# Patient Record
Sex: Male | Born: 1965 | Race: Black or African American | Hispanic: No | Marital: Married | Smoking: Current every day smoker
Health system: Southern US, Community
[De-identification: ages and names within clinical notes are randomized; demographics above are authoritative.]

## PROBLEM LIST (undated history)

## (undated) DIAGNOSIS — I10 Essential (primary) hypertension: Secondary | ICD-10-CM

## (undated) DIAGNOSIS — E785 Hyperlipidemia, unspecified: Secondary | ICD-10-CM

## (undated) DIAGNOSIS — E119 Type 2 diabetes mellitus without complications: Secondary | ICD-10-CM

## (undated) HISTORY — DX: Hyperlipidemia, unspecified: E78.5

## (undated) HISTORY — DX: Essential (primary) hypertension: I10

## (undated) HISTORY — DX: Type 2 diabetes mellitus without complications: E11.9

---

## 2010-10-25 ENCOUNTER — Emergency Department (HOSPITAL_COMMUNITY): Payer: Medicare Other

## 2010-10-25 ENCOUNTER — Emergency Department (HOSPITAL_COMMUNITY)
Admission: EM | Admit: 2010-10-25 | Discharge: 2010-10-25 | Disposition: A | Discharge status: Home / Self Care | Payer: Medicare Other | Attending: Emergency Medicine | Admitting: Emergency Medicine

## 2010-10-25 DIAGNOSIS — R350 Frequency of micturition: Secondary | ICD-10-CM | POA: Insufficient documentation

## 2010-10-25 DIAGNOSIS — R509 Fever, unspecified: Secondary | ICD-10-CM | POA: Insufficient documentation

## 2010-10-25 DIAGNOSIS — N390 Urinary tract infection, site not specified: Secondary | ICD-10-CM | POA: Insufficient documentation

## 2010-10-25 DIAGNOSIS — M542 Cervicalgia: Secondary | ICD-10-CM | POA: Insufficient documentation

## 2010-10-25 DIAGNOSIS — R51 Headache: Secondary | ICD-10-CM | POA: Insufficient documentation

## 2010-10-25 LAB — DIFFERENTIAL
Basophils Absolute: 0 10*3/uL (ref 0.0–0.1)
Lymphs Abs: 2.2 10*3/uL (ref 0.7–4.0)
Monocytes Absolute: 2 10*3/uL — ABNORMAL HIGH (ref 0.1–1.0)
Monocytes Relative: 10 % (ref 3–12)
Neutrophils Relative %: 79 % — ABNORMAL HIGH (ref 43–77)

## 2010-10-25 LAB — CBC
Platelets: 201 10*3/uL (ref 150–400)
RDW: 14.6 % (ref 11.5–15.5)
WBC: 19.8 10*3/uL — ABNORMAL HIGH (ref 4.0–10.5)

## 2010-10-25 LAB — BASIC METABOLIC PANEL
CO2: 27 mEq/L (ref 19–32)
Calcium: 8.6 mg/dL (ref 8.4–10.5)
Creatinine, Ser: 1.17 mg/dL (ref 0.50–1.35)
Glucose, Bld: 113 mg/dL — ABNORMAL HIGH (ref 70–99)
Sodium: 137 mEq/L (ref 135–145)

## 2010-10-25 LAB — URINE MICROSCOPIC-ADD ON

## 2010-10-25 LAB — URINALYSIS, ROUTINE W REFLEX MICROSCOPIC
Nitrite: NEGATIVE
Protein, ur: NEGATIVE mg/dL
Urobilinogen, UA: 1 mg/dL (ref 0.0–1.0)

## 2010-10-28 LAB — URINE CULTURE

## 2012-11-26 IMAGING — CT CT HEAD W/O CM
1 series · 16 of 30 positions shown, 20 images · non-contrast
Comparison: None.

CLINICAL DATA: Migraine headache.  Fever and leukocytosis.

CT HEAD WITHOUT CONTRAST
TECHNIQUE: Contiguous axial images were obtained from the base of
the skull through the vertex without contrast.

[Series 2: headseq 4.8 h37s · axial · 0.50mm/px · z∈[+164,+301]mm · 16 of 30 slices shown, 20 images]
[im 2/30  brain]
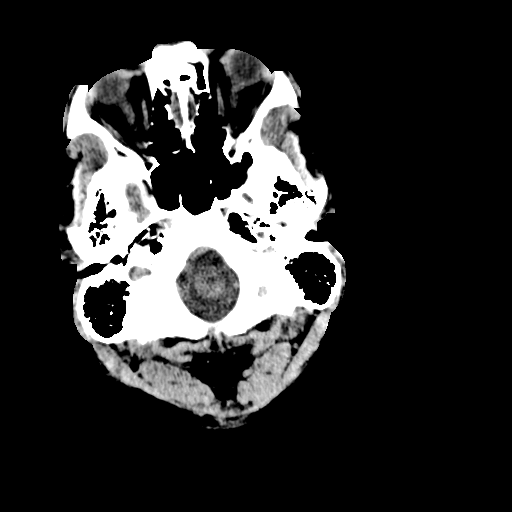
[im 2/30  bone]
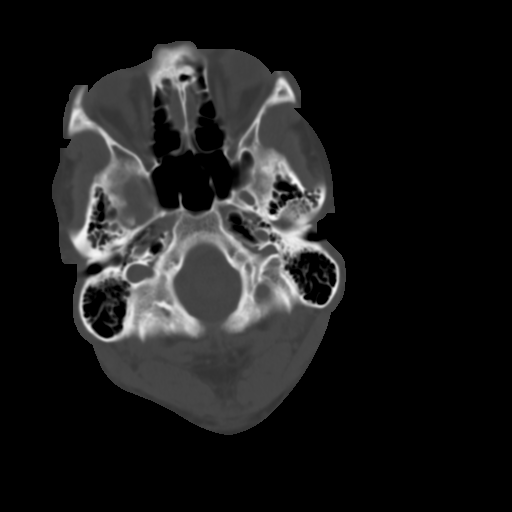
[im 4/30  brain]
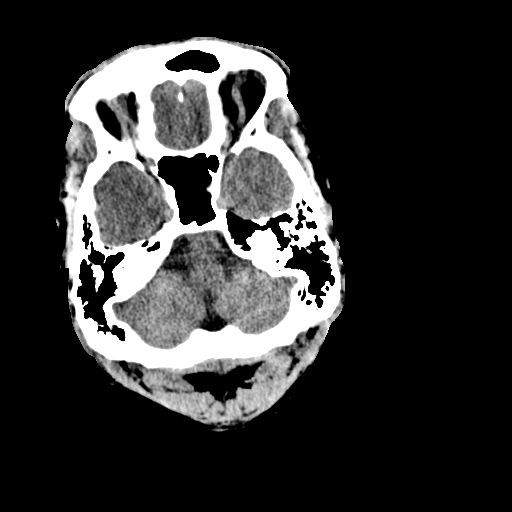
[im 6/30  brain]
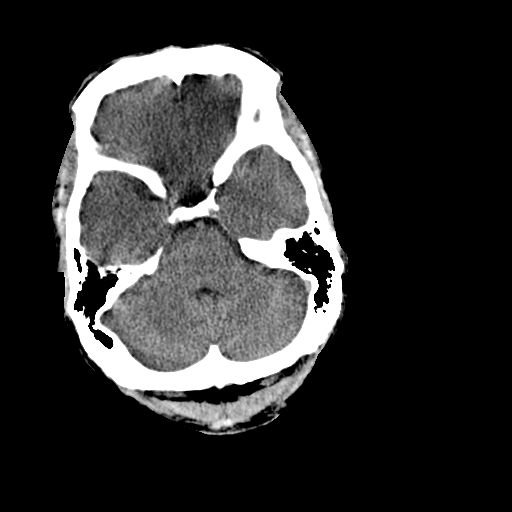
[im 8/30  brain]
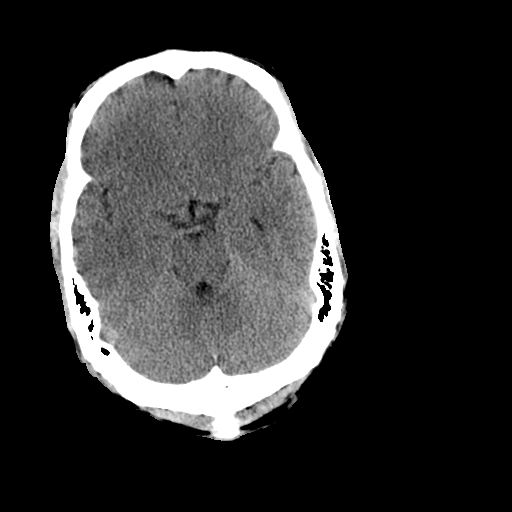
[im 9/30  brain]
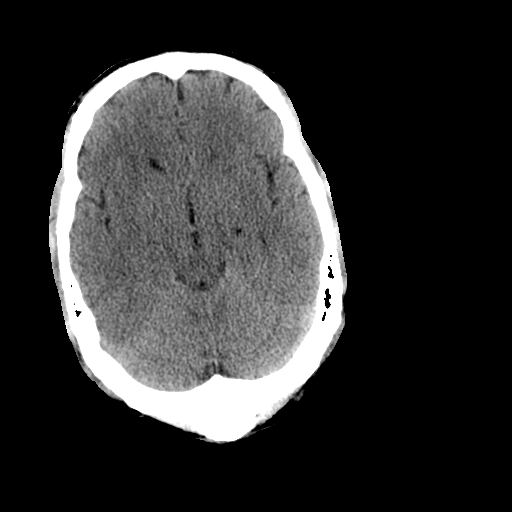
[im 9/30  bone]
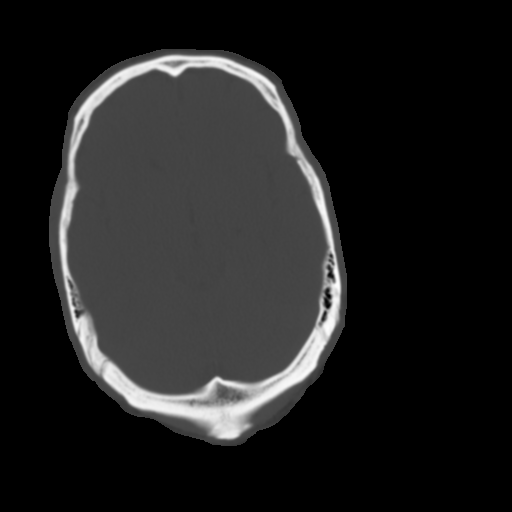
[im 11/30  brain]
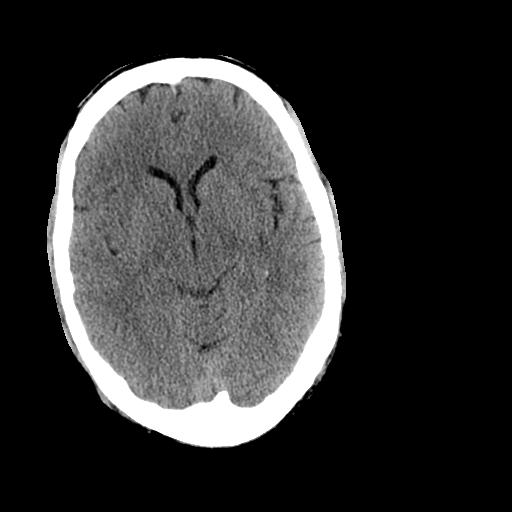
[im 13/30  brain]
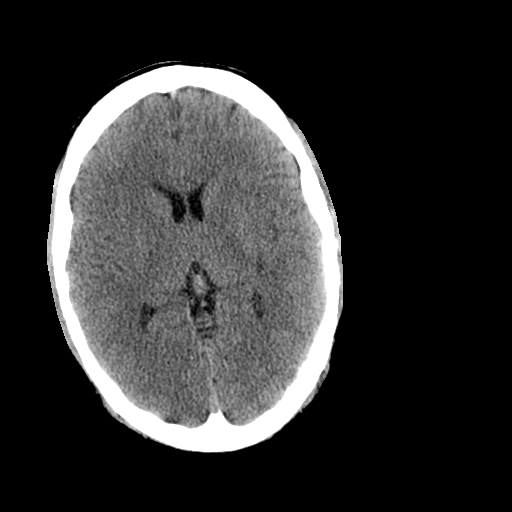
[im 15/30  brain]
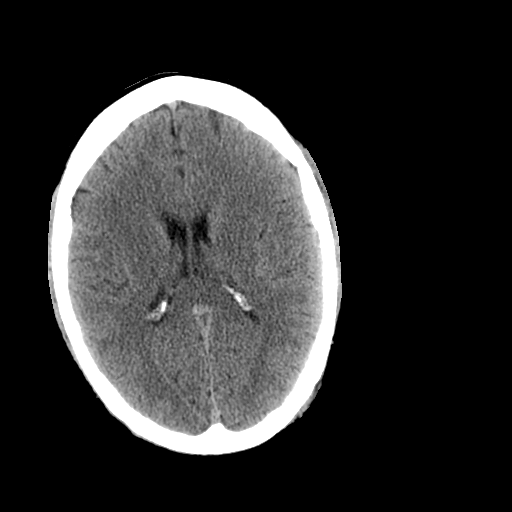
[im 16/30  brain]
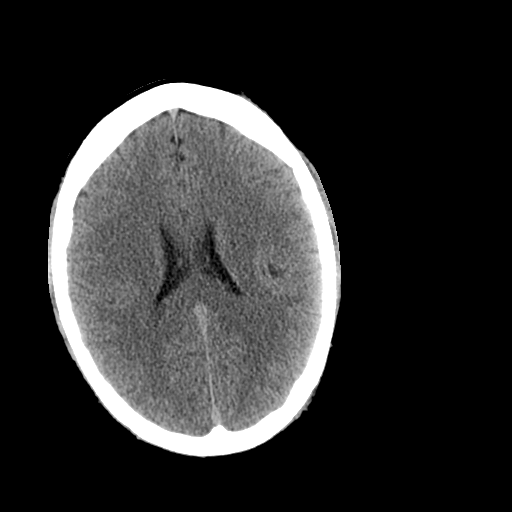
[im 16/30  bone]
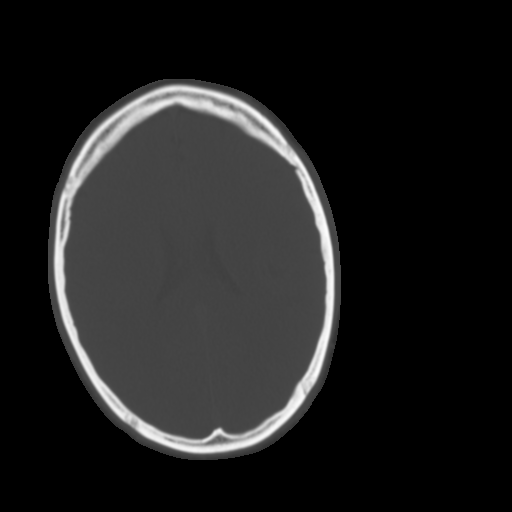
[im 18/30  brain]
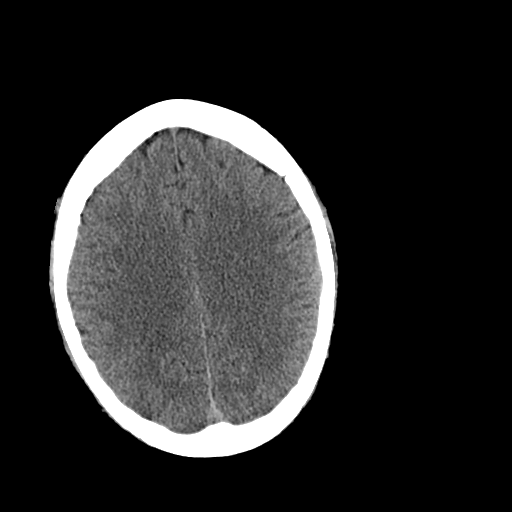
[im 20/30  brain]
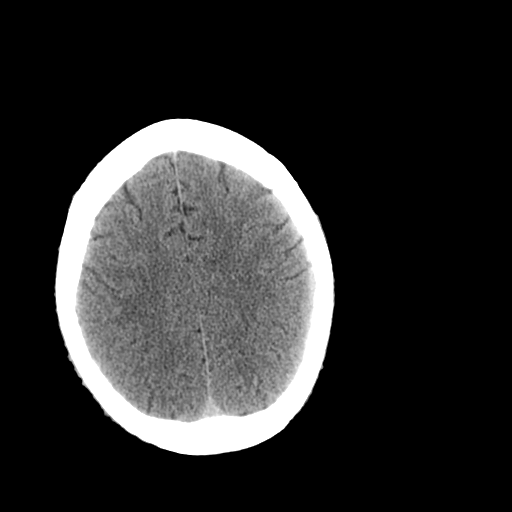
[im 22/30  brain]
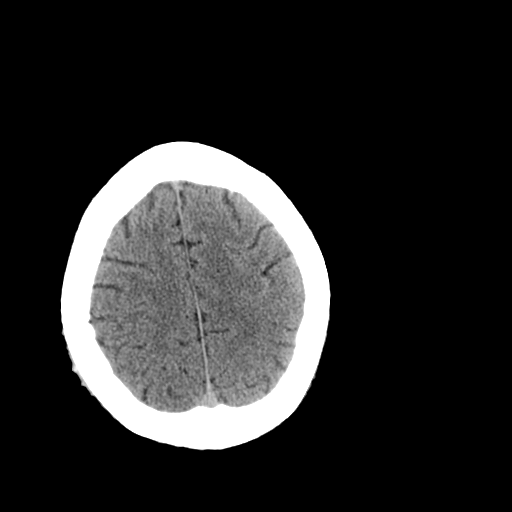
[im 23/30  brain]
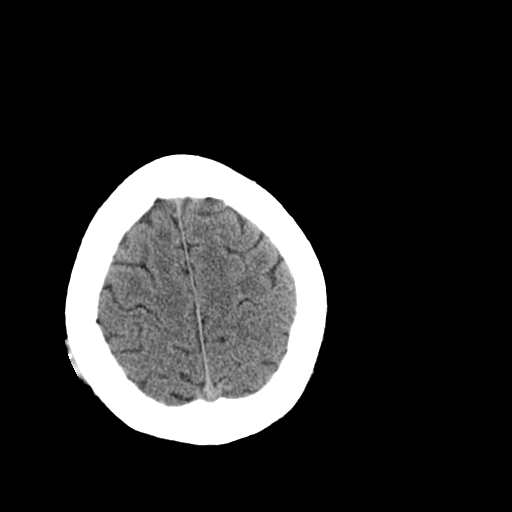
[im 23/30  bone]
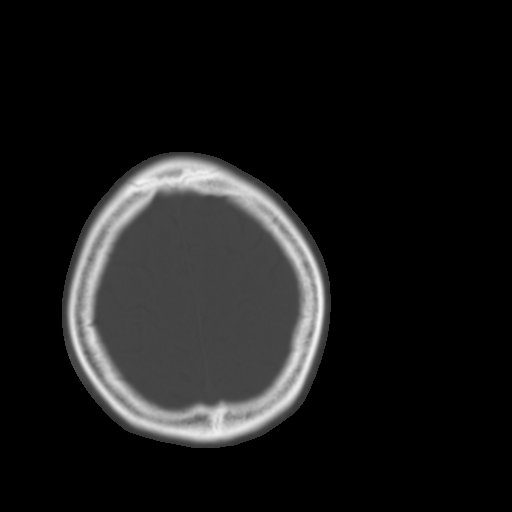
[im 25/30  brain]
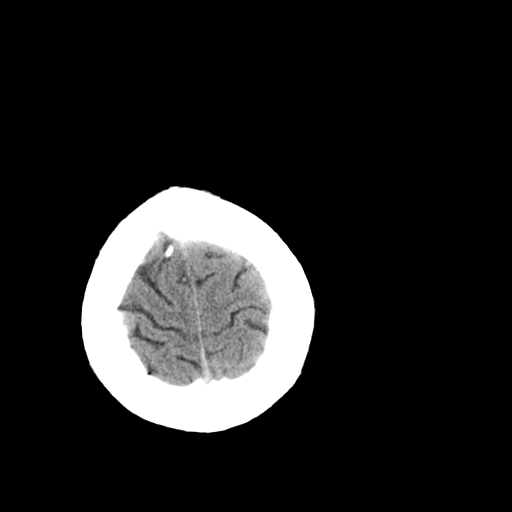
[im 27/30  brain]
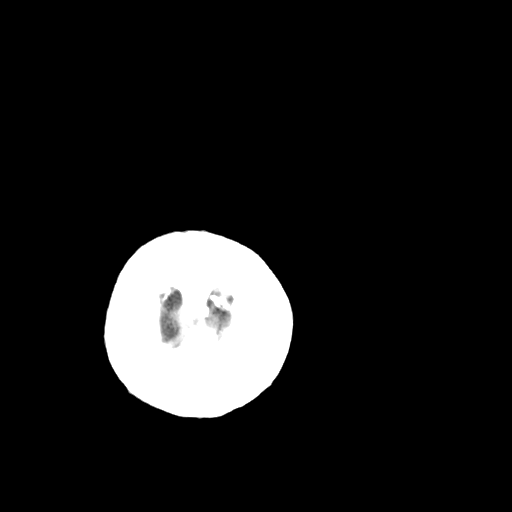
[im 29/30  brain]
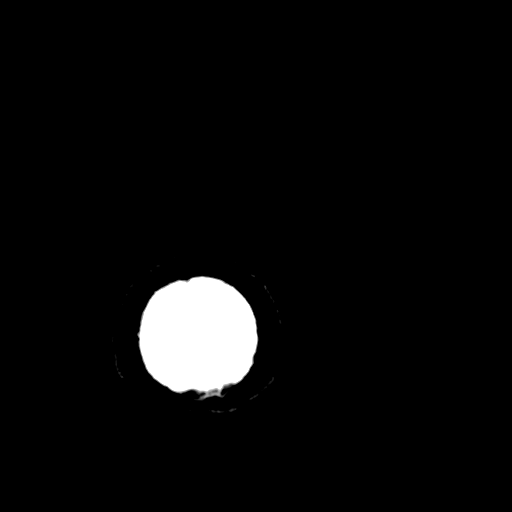

[16 of 30 positions shown; findings below may reference images not displayed]

FINDINGS: There is no evidence of acute intracranial hemorrhage,
mass lesion, brain edema or extra-axial fluid collection.  The
ventricles and subarachnoid spaces are appropriately sized for age.
There is no CT evidence of acute cortical infarction.

The visualized paranasal sinuses are clear. The calvarium is
intact.
IMPRESSION: Unremarkable noncontrast head CT.

## 2012-11-26 IMAGING — CR DG CHEST 1V PORT
1 series · 1 of 1 positions shown · non-contrast
Comparison: None.

CLINICAL DATA: Chest pain, fever

PORTABLE CHEST - 1 VIEW

[view not recorded]
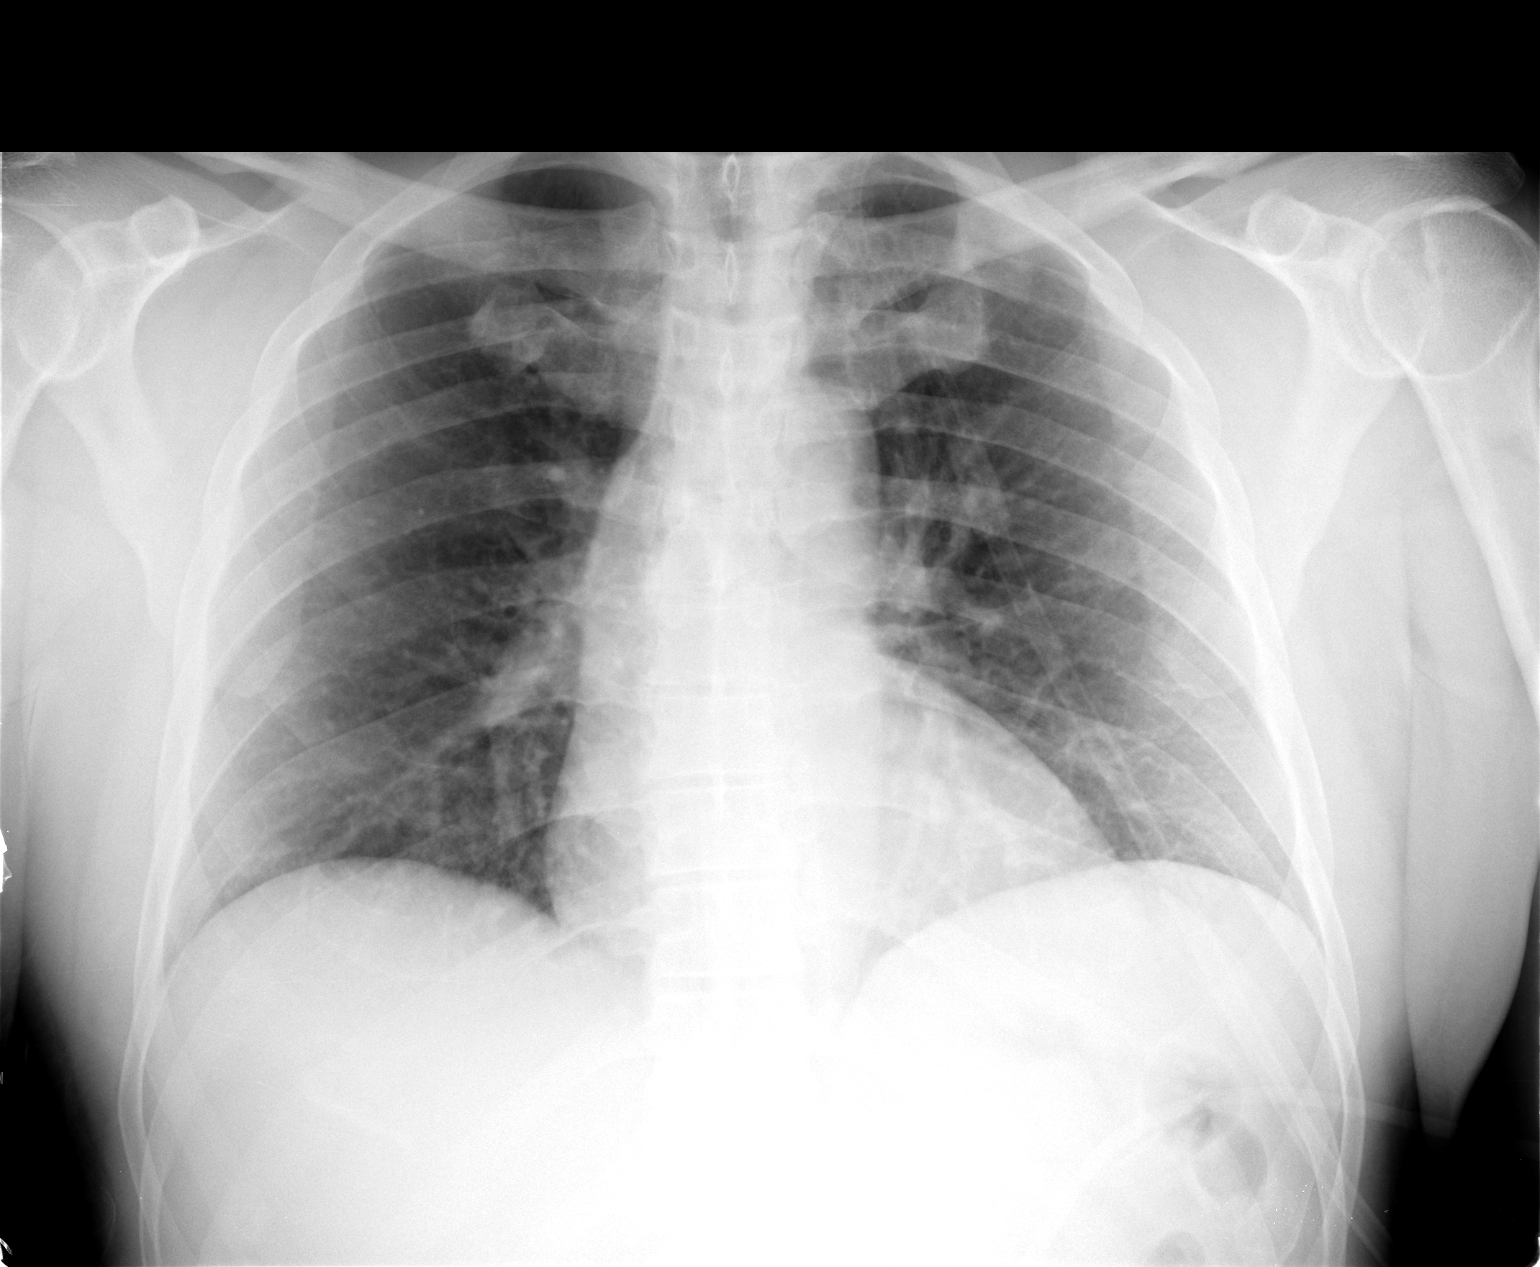

[1 of 1 positions shown; findings below may reference images not displayed]

FINDINGS: The lungs are clear.  Mediastinal contours appear normal.
The heart is within normal limits in size.  No bony abnormality is
seen.
IMPRESSION: No active lung disease.

## 2020-06-21 LAB — COMPREHENSIVE METABOLIC PANEL
Calcium: 9.1 (ref 8.7–10.7)
GFR calc Af Amer: 105

## 2020-06-21 LAB — HEMOGLOBIN A1C: Hemoglobin A1C: 6.7

## 2020-06-21 LAB — TSH: TSH: 2.22 (ref 0.41–5.90)

## 2020-06-21 LAB — BASIC METABOLIC PANEL
BUN: 8 (ref 4–21)
Creatinine: 1 (ref 0.6–1.3)
Glucose: 92

## 2020-06-21 LAB — LIPID PANEL
LDL Cholesterol: 122
Triglycerides: 122 (ref 40–160)

## 2020-08-26 ENCOUNTER — Ambulatory Visit: Payer: Medicare Other | Admitting: Nurse Practitioner

## 2020-08-26 NOTE — Patient Instructions (Incomplete)
Diabetes Mellitus and Nutrition, Adult When you have diabetes, or diabetes mellitus, it is very important to have healthy eating habits because your blood sugar (glucose) levels are greatly affected by what you eat and drink. Eating healthy foods in the right amounts, at about the same times every day, can help you:  Control your blood glucose.  Lower your risk of heart disease.  Improve your blood pressure.  Reach or maintain a healthy weight. What can affect my meal plan? Every person with diabetes is different, and each person has different needs for a meal plan. Your health care provider may recommend that you work with a dietitian to make a meal plan that is best for you. Your meal plan may vary depending on factors such as:  The calories you need.  The medicines you take.  Your weight.  Your blood glucose, blood pressure, and cholesterol levels.  Your activity level.  Other health conditions you have, such as heart or kidney disease. How do carbohydrates affect me? Carbohydrates, also called carbs, affect your blood glucose level more than any other type of food. Eating carbs naturally raises the amount of glucose in your blood. Carb counting is a method for keeping track of how many carbs you eat. Counting carbs is important to keep your blood glucose at a healthy level, especially if you use insulin or take certain oral diabetes medicines. It is important to know how many carbs you can safely have in each meal. This is different for every person. Your dietitian can help you calculate how many carbs you should have at each meal and for each snack. How does alcohol affect me? Alcohol can cause a sudden decrease in blood glucose (hypoglycemia), especially if you use insulin or take certain oral diabetes medicines. Hypoglycemia can be a life-threatening condition. Symptoms of hypoglycemia, such as sleepiness, dizziness, and confusion, are similar to symptoms of having too much  alcohol.  Do not drink alcohol if: ? Your health care provider tells you not to drink. ? You are pregnant, may be pregnant, or are planning to become pregnant.  If you drink alcohol: ? Do not drink on an empty stomach. ? Limit how much you use to:  0-1 drink a day for women.  0-2 drinks a day for men. ? Be aware of how much alcohol is in your drink. In the [place] one drink equals one 12 oz bottle of beer (355 mL), one 5 oz glass of wine (148 mL), or one 1 oz glass of hard liquor (44 mL). ? Keep yourself hydrated with water, diet soda, or unsweetened iced tea.  Keep in mind that regular soda, juice, and other mixers may contain a lot of sugar and must be counted as carbs. What are tips for following this plan? Reading food labels  Start by checking the serving size on the "Nutrition Facts" label of packaged foods and drinks. The amount of calories, carbs, fats, and other nutrients listed on the label is based on one serving of the item. Many items contain more than one serving per package.  Check the total grams (g) of carbs in one serving. You can calculate the number of servings of carbs in one serving by dividing the total carbs by 15. For example, if a food has 30 g of total carbs per serving, it would be equal to 2 servings of carbs.  Check the number of grams (g) of saturated fats and trans fats in one serving. Choose foods that have   a low amount or none of these fats.  Check the number of milligrams (mg) of salt (sodium) in one serving. Most people should limit total sodium intake to less than 2,300 mg per day.  Always check the nutrition information of foods labeled as "low-fat" or "nonfat." These foods may be higher in added sugar or refined carbs and should be avoided.  Talk to your dietitian to identify your daily goals for nutrients listed on the label. Shopping  Avoid buying canned, pre-made, or processed foods. These foods tend to be high in fat, sodium, and added  sugar.  Shop around the outside edge of the grocery store. This is where you will most often find fresh fruits and vegetables, bulk grains, fresh meats, and fresh dairy. Cooking  Use low-heat cooking methods, such as baking, instead of high-heat cooking methods like deep frying.  Cook using healthy oils, such as olive, canola, or sunflower oil.  Avoid cooking with butter, cream, or high-fat meats. Meal planning  Eat meals and snacks regularly, preferably at the same times every day. Avoid going long periods of time without eating.  Eat foods that are high in fiber, such as fresh fruits, vegetables, beans, and whole grains. Talk with your dietitian about how many servings of carbs you can eat at each meal.  Eat 4-6 oz (112-168 g) of lean protein each day, such as lean meat, chicken, fish, eggs, or tofu. One ounce (oz) of lean protein is equal to: ? 1 oz (28 g) of meat, chicken, or fish. ? 1 egg. ?  cup (62 g) of tofu.  Eat some foods each day that contain healthy fats, such as avocado, nuts, seeds, and fish.   What foods should I eat? Fruits Berries. Apples. Oranges. Peaches. Apricots. Plums. Grapes. Mango. Papaya. Pomegranate. Kiwi. Cherries. Vegetables Lettuce. Spinach. Leafy greens, including kale, chard, collard greens, and mustard greens. Beets. Cauliflower. Cabbage. Broccoli. Carrots. Green beans. Tomatoes. Peppers. Onions. Cucumbers. Brussels sprouts. Grains Whole grains, such as whole-wheat or whole-grain bread, crackers, tortillas, cereal, and pasta. Unsweetened oatmeal. Quinoa. Brown or wild rice. Meats and other proteins Seafood. Poultry without skin. Lean cuts of poultry and beef. Tofu. Nuts. Seeds. Dairy Low-fat or fat-free dairy products such as milk, yogurt, and cheese. The items listed above may not be a complete list of foods and beverages you can eat. Contact a dietitian for more information. What foods should I avoid? Fruits Fruits canned with  syrup. Vegetables Canned vegetables. Frozen vegetables with butter or cream sauce. Grains Refined white flour and flour products such as bread, pasta, snack foods, and cereals. Avoid all processed foods. Meats and other proteins Fatty cuts of meat. Poultry with skin. Breaded or fried meats. Processed meat. Avoid saturated fats. Dairy Full-fat yogurt, cheese, or milk. Beverages Sweetened drinks, such as soda or iced tea. The items listed above may not be a complete list of foods and beverages you should avoid. Contact a dietitian for more information. Questions to ask a health care provider  Do I need to meet with a diabetes educator?  Do I need to meet with a dietitian?  What number can I call if I have questions?  When are the best times to check my blood glucose? Where to find more information:  American Diabetes Association: diabetes.org  Academy of Nutrition and Dietetics: www.eatright.org  National Institute of Diabetes and Digestive and Kidney Diseases: www.niddk.nih.gov  Association of Diabetes Care and Education Specialists: www.diabeteseducator.org Summary  It is important to have healthy eating   habits because your blood sugar (glucose) levels are greatly affected by what you eat and drink.  A healthy meal plan will help you control your blood glucose and maintain a healthy lifestyle.  Your health care provider may recommend that you work with a dietitian to make a meal plan that is best for you.  Keep in mind that carbohydrates (carbs) and alcohol have immediate effects on your blood glucose levels. It is important to count carbs and to use alcohol carefully. This information is not intended to replace advice given to you by your health care provider. Make sure you discuss any questions you have with your health care provider. Document Revised: 03/18/2019 Document Reviewed: 03/18/2019 Elsevier Patient Education  2021 [employer]  

## 2020-09-14 ENCOUNTER — Ambulatory Visit: Payer: Medicare Other | Admitting: Nurse Practitioner

## 2020-09-29 ENCOUNTER — Ambulatory Visit (INDEPENDENT_AMBULATORY_CARE_PROVIDER_SITE_OTHER): Payer: Medicare Other | Admitting: Nurse Practitioner

## 2020-09-29 ENCOUNTER — Encounter: Payer: Self-pay | Admitting: Nurse Practitioner

## 2020-09-29 VITALS — BP 145/97 | HR 65 | Ht 72.0 in | Wt 258.0 lb

## 2020-09-29 DIAGNOSIS — E782 Mixed hyperlipidemia: Secondary | ICD-10-CM | POA: Diagnosis not present

## 2020-09-29 DIAGNOSIS — I10 Essential (primary) hypertension: Secondary | ICD-10-CM | POA: Diagnosis not present

## 2020-09-29 DIAGNOSIS — E119 Type 2 diabetes mellitus without complications: Secondary | ICD-10-CM

## 2020-09-29 LAB — POCT GLYCOSYLATED HEMOGLOBIN (HGB A1C): Hemoglobin A1C: 6.5 % — AB (ref 4.0–5.6)

## 2020-09-29 MED ORDER — METFORMIN HCL ER 500 MG PO TB24: 500.0000 mg | ORAL_TABLET | Freq: Every day | ORAL | 3 refills | Status: AC

## 2020-09-29 NOTE — Patient Instructions (Signed)
Diabetes Mellitus and Nutrition, Adult When you have diabetes, or diabetes mellitus, it is very important to have healthy eating habits because your blood sugar (glucose) levels are greatly affected by what you eat and drink. Eating healthy foods in the right amounts, at about the same times every day, can help you:  Control your blood glucose.  Lower your risk of heart disease.  Improve your blood pressure.  Reach or maintain a healthy weight. What can affect my meal plan? Every person with diabetes is different, and each person has different needs for a meal plan. Your health care provider may recommend that you work with a dietitian to make a meal plan that is best for you. Your meal plan may vary depending on factors such as:  The calories you need.  The medicines you take.  Your weight.  Your blood glucose, blood pressure, and cholesterol levels.  Your activity level.  Other health conditions you have, such as heart or kidney disease. How do carbohydrates affect me? Carbohydrates, also called carbs, affect your blood glucose level more than any other type of food. Eating carbs naturally raises the amount of glucose in your blood. Carb counting is a method for keeping track of how many carbs you eat. Counting carbs is important to keep your blood glucose at a healthy level, especially if you use insulin or take certain oral diabetes medicines. It is important to know how many carbs you can safely have in each meal. This is different for every person. Your dietitian can help you calculate how many carbs you should have at each meal and for each snack. How does alcohol affect me? Alcohol can cause a sudden decrease in blood glucose (hypoglycemia), especially if you use insulin or take certain oral diabetes medicines. Hypoglycemia can be a life-threatening condition. Symptoms of hypoglycemia, such as sleepiness, dizziness, and confusion, are similar to symptoms of having too much  alcohol.  Do not drink alcohol if: ? Your health care provider tells you not to drink. ? You are pregnant, may be pregnant, or are planning to become pregnant.  If you drink alcohol: ? Do not drink on an empty stomach. ? Limit how much you use to:  0-1 drink a day for women.  0-2 drinks a day for men. ? Be aware of how much alcohol is in your drink. In the [place] one drink equals one 12 oz bottle of beer (355 mL), one 5 oz glass of wine (148 mL), or one 1 oz glass of hard liquor (44 mL). ? Keep yourself hydrated with water, diet soda, or unsweetened iced tea.  Keep in mind that regular soda, juice, and other mixers may contain a lot of sugar and must be counted as carbs. What are tips for following this plan? Reading food labels  Start by checking the serving size on the "Nutrition Facts" label of packaged foods and drinks. The amount of calories, carbs, fats, and other nutrients listed on the label is based on one serving of the item. Many items contain more than one serving per package.  Check the total grams (g) of carbs in one serving. You can calculate the number of servings of carbs in one serving by dividing the total carbs by 15. For example, if a food has 30 g of total carbs per serving, it would be equal to 2 servings of carbs.  Check the number of grams (g) of saturated fats and trans fats in one serving. Choose foods that have   a low amount or none of these fats.  Check the number of milligrams (mg) of salt (sodium) in one serving. Most people should limit total sodium intake to less than 2,300 mg per day.  Always check the nutrition information of foods labeled as "low-fat" or "nonfat." These foods may be higher in added sugar or refined carbs and should be avoided.  Talk to your dietitian to identify your daily goals for nutrients listed on the label. Shopping  Avoid buying canned, pre-made, or processed foods. These foods tend to be high in fat, sodium, and added  sugar.  Shop around the outside edge of the grocery store. This is where you will most often find fresh fruits and vegetables, bulk grains, fresh meats, and fresh dairy. Cooking  Use low-heat cooking methods, such as baking, instead of high-heat cooking methods like deep frying.  Cook using healthy oils, such as olive, canola, or sunflower oil.  Avoid cooking with butter, cream, or high-fat meats. Meal planning  Eat meals and snacks regularly, preferably at the same times every day. Avoid going long periods of time without eating.  Eat foods that are high in fiber, such as fresh fruits, vegetables, beans, and whole grains. Talk with your dietitian about how many servings of carbs you can eat at each meal.  Eat 4-6 oz (112-168 g) of lean protein each day, such as lean meat, chicken, fish, eggs, or tofu. One ounce (oz) of lean protein is equal to: ? 1 oz (28 g) of meat, chicken, or fish. ? 1 egg. ?  cup (62 g) of tofu.  Eat some foods each day that contain healthy fats, such as avocado, nuts, seeds, and fish.   What foods should I eat? Fruits Berries. Apples. Oranges. Peaches. Apricots. Plums. Grapes. Mango. Papaya. Pomegranate. Kiwi. Cherries. Vegetables Lettuce. Spinach. Leafy greens, including kale, chard, collard greens, and mustard greens. Beets. Cauliflower. Cabbage. Broccoli. Carrots. Green beans. Tomatoes. Peppers. Onions. Cucumbers. Brussels sprouts. Grains Whole grains, such as whole-wheat or whole-grain bread, crackers, tortillas, cereal, and pasta. Unsweetened oatmeal. Quinoa. Brown or wild rice. Meats and other proteins Seafood. Poultry without skin. Lean cuts of poultry and beef. Tofu. Nuts. Seeds. Dairy Low-fat or fat-free dairy products such as milk, yogurt, and cheese. The items listed above may not be a complete list of foods and beverages you can eat. Contact a dietitian for more information. What foods should I avoid? Fruits Fruits canned with  syrup. Vegetables Canned vegetables. Frozen vegetables with butter or cream sauce. Grains Refined white flour and flour products such as bread, pasta, snack foods, and cereals. Avoid all processed foods. Meats and other proteins Fatty cuts of meat. Poultry with skin. Breaded or fried meats. Processed meat. Avoid saturated fats. Dairy Full-fat yogurt, cheese, or milk. Beverages Sweetened drinks, such as soda or iced tea. The items listed above may not be a complete list of foods and beverages you should avoid. Contact a dietitian for more information. Questions to ask a health care provider  Do I need to meet with a diabetes educator?  Do I need to meet with a dietitian?  What number can I call if I have questions?  When are the best times to check my blood glucose? Where to find more information:  American Diabetes Association: diabetes.org  Academy of Nutrition and Dietetics: www.eatright.org  National Institute of Diabetes and Digestive and Kidney Diseases: www.niddk.nih.gov  Association of Diabetes Care and Education Specialists: www.diabeteseducator.org Summary  It is important to have healthy eating   habits because your blood sugar (glucose) levels are greatly affected by what you eat and drink.  A healthy meal plan will help you control your blood glucose and maintain a healthy lifestyle.  Your health care provider may recommend that you work with a dietitian to make a meal plan that is best for you.  Keep in mind that carbohydrates (carbs) and alcohol have immediate effects on your blood glucose levels. It is important to count carbs and to use alcohol carefully. This information is not intended to replace advice given to you by your health care provider. Make sure you discuss any questions you have with your health care provider. Document Revised: 03/18/2019 Document Reviewed: 03/18/2019 Elsevier Patient Education  2021 [employer].  

## 2020-09-29 NOTE — Progress Notes (Signed)
Endocrinology Consult Note       09/29/2020, 3:07 PM   Subjective:    Patient ID: Alejandro Horton, male    DOB: [birth year].  Alejandro Horton is being seen in consultation for management of currently uncontrolled symptomatic diabetes requested by  Smith Robert, MD.   Past Medical History:  Diagnosis Date  . Diabetes mellitus, type II (HCC)   . Hyperlipidemia   . Hypertension     History reviewed. No pertinent surgical history.  Social History   Socioeconomic History  . Marital status: Married    Spouse name: Not on file  . Number of children: Not on file  . Years of education: Not on file  . Highest education level: Not on file  Occupational History  . Not on file  Tobacco Use  . Smoking status: Not on file  . Smokeless tobacco: Not on file  Substance and Sexual Activity  . Alcohol use: Not on file  . Drug use: Not on file  . Sexual activity: Not on file  Other Topics Concern  . Not on file  Social History Narrative  . Not on file   Social Determinants of Health   Financial Resource Strain: Not on file  Food Insecurity: Not on file  Transportation Needs: Not on file  Physical Activity: Not on file  Stress: Not on file  Social Connections: Not on file    History reviewed. No pertinent family history.  Outpatient Encounter Medications as of 09/29/2020  Medication Sig  . amLODipine (NORVASC) 2.5 MG tablet Take by mouth daily.  . fluconazole (DIFLUCAN) 200 MG tablet Take 200 mg by mouth daily.  . hydrochlorothiazide (HYDRODIURIL) 25 MG tablet Take 25 mg by mouth daily.  . metFORMIN (GLUCOPHAGE-XR) 500 MG 24 hr tablet Take 1 tablet (500 mg total) by mouth daily with breakfast.  . montelukast (SINGULAIR) 10 MG tablet daily.  . traZODone (DESYREL) 100 MG tablet at bedtime.  Marland Kitchen atorvastatin (LIPITOR) 20 MG tablet daily.  . clotrimazole (MYCELEX) 10 MG troche Take 10 mg by mouth 5 (five) times daily.  Marland Kitchen  losartan (COZAAR) 50 MG tablet Take 1 tablet by mouth daily.  Marland Kitchen omeprazole (PRILOSEC) 20 MG capsule daily.  . Oxycodone HCl 10 MG TABS in the morning, at noon, in the evening, and at bedtime.  . [DISCONTINUED] metFORMIN (GLUCOPHAGE) 500 MG tablet Take 1 tablet by mouth daily.   No facility-administered encounter medications on file as of 09/29/2020.    ALLERGIES: No Known Allergies  VACCINATION STATUS:  There is no immunization history on file for this patient.  Diabetes He presents for his initial diabetic visit. He has type 2 diabetes mellitus. Onset time: Recently diagnosed at age of 50. His disease course has been improving. There are no hypoglycemic associated symptoms. Associated symptoms include weight loss. (Persistent thrush) There are no hypoglycemic complications. Symptoms are stable. There are no diabetic complications. Risk factors for coronary artery disease include diabetes mellitus, dyslipidemia, hypertension, male sex, obesity, sedentary lifestyle and tobacco exposure. Current diabetic treatment includes oral agent (monotherapy). He is compliant with treatment most of the time. His weight is decreasing steadily. He is following a generally unhealthy diet. When asked about meal  planning, he reported none. He has not had a previous visit with a dietitian. He rarely participates in exercise. His home blood glucose trend is fluctuating minimally. His overall blood glucose range is 180-200 mg/dl. (He presents today for his consultation, accompanied by his wife, with no meter or logs to review.  They have been checking glucose every other day with readings between 180-220.  His POCT A1c today is 6.5%, improving from last reading of 6.7% on 2/28.  He was recently diagnosed with diabetes in February of this year in which he was started on Metformin 500 mg po daily.  He admits to drinking water, coffee (with cream and sugar), and hot chocolate.  He typically skips lunch on most days and snacks  occasionally.  He does not engage in routine physical activity due to recent back pain.  He has not had a recent eye exam.  He denies any s/s of hypoglycemia.  His main concern is his thrush that he has been suffering from for over 9 months now despite his PCPs best attempts to treat it.) An ACE inhibitor/angiotensin II receptor blocker is being taken. He does not see a podiatrist.Eye exam is not current.  Hypertension This is a chronic problem. The current episode started more than 1 year ago. The problem has been gradually improving since onset. The problem is uncontrolled. There are no associated agents to hypertension. Risk factors for coronary artery disease include diabetes mellitus, dyslipidemia, family history, obesity, male gender, sedentary lifestyle and smoking/tobacco exposure. Past treatments include calcium channel blockers, diuretics and angiotensin blockers. The current treatment provides mild improvement. Compliance problems include diet and exercise.   Hyperlipidemia This is a chronic problem. The current episode started more than 1 year ago. The problem is uncontrolled. Recent lipid tests were reviewed and are high. Exacerbating diseases include diabetes and obesity. Factors aggravating his hyperlipidemia include fatty foods, thiazides and smoking. Current antihyperlipidemic treatment includes statins. The current treatment provides mild improvement of lipids. Compliance problems include adherence to diet and adherence to exercise.  Risk factors for coronary artery disease include diabetes mellitus, dyslipidemia, family history, hypertension, male sex, obesity and a sedentary lifestyle.     Review of systems  Constitutional: + steadily decreasing body weight (per patient), current Body mass index is 34.99 kg/m., no fatigue, no subjective hyperthermia, no subjective hypothermia Eyes: no blurry vision, no xerophthalmia ENT: no sore throat, no nodules palpated in throat, no  dysphagia/odynophagia, no hoarseness, + thrush infection in mouth for over 9 months Cardiovascular: no chest pain, no shortness of breath, no palpitations, no leg swelling Respiratory: + productive cough with brownish black sputum, no shortness of breath Gastrointestinal: no nausea/vomiting/diarrhea Musculoskeletal: c/o back pain Skin: no rashes, no hyperemia Neurological: no tremors, no numbness, no tingling, no dizziness Psychiatric: no depression, no anxiety  Objective:     BP (!) 145/97   Pulse 65   Ht 6' (1.829 m)   Wt 258 lb (117 kg)   BMI 34.99 kg/m   Wt Readings from Last 3 Encounters:  09/29/20 258 lb (117 kg)     BP Readings from Last 3 Encounters:  09/29/20 (!) 145/97     Physical Exam- Limited  Constitutional:  Body mass index is 34.99 kg/m. , not in acute distress, normal state of mind Eyes:  EOMI, no exophthalmos Neck: Supple Cardiovascular: RRR, no murmurs, rubs, or gallops, no edema Respiratory: Adequate breathing efforts, no crackles, rales, rhonchi, or wheezing Musculoskeletal: no gross deformities, strength intact in all four  extremities, no gross restriction of joint movements Skin:  no rashes, no hyperemia Neurological: no tremor with outstretched hands    CMP ( most recent) CMP     Component Value Date/Time   NA 137 10/25/2010 1709   K 3.5 10/25/2010 1709   CL 104 10/25/2010 1709   CO2 27 10/25/2010 1709   GLUCOSE 113 (H) 10/25/2010 1709   BUN 8 06/21/2020 0000   CREATININE 1.0 06/21/2020 0000   CREATININE 1.17 10/25/2010 1709   CALCIUM 9.1 06/21/2020 0000   GFRNONAA >60 10/25/2010 1709   GFRAA 105 06/21/2020 0000     Diabetic Labs (most recent): Lab Results  Component Value Date   HGBA1C 6.5 (A) 09/29/2020   HGBA1C 6.7 06/21/2020     Lipid Panel ( most recent) Lipid Panel     Component Value Date/Time   TRIG 122 06/21/2020 0000   LDLCALC 122 06/21/2020 0000      Lab Results  Component Value Date   TSH 2.22 06/21/2020            Assessment & Plan:   1) Type 2 diabetes mellitus without complication, without long-term current use of insulin (HCC)  He presents today for his consultation, accompanied by his wife, with no meter or logs to review.  They have been checking glucose every other day with readings between 180-220.  His POCT A1c today is 6.5%, improving from last reading of 6.7% on 2/28.  He was recently diagnosed with diabetes in February of this year in which he was started on Metformin 500 mg po daily.  He admits to drinking water, coffee (with cream and sugar), and hot chocolate.  He typically skips lunch on most days and snacks occasionally.  He does not engage in routine physical activity due to recent back pain.  He has not had a recent eye exam.  He denies any s/s of hypoglycemia.  His main concern is his thrush that he has been suffering from for over 9 months now despite his PCPs best attempts to treat it.  - Alejandro Horton has currently uncontrolled symptomatic type 2 DM since 55 years of age (newly diagnosed), with most recent A1c of 6.5 %.   -Recent labs reviewed.  - I had a long discussion with him about the progressive nature of diabetes and the pathology behind its complications. -his diabetes is complicated by tobacco use and persistent thrush infection and he remains at a high risk for more acute and chronic complications which include CAD, CVA, CKD, retinopathy, and neuropathy. These are all discussed in detail with him.  - I have counseled him on diet and weight management by adopting a carbohydrate restricted/protein rich diet. Patient is encouraged to switch to unprocessed or minimally processed complex starch and increased protein intake (animal or plant source), fruits, and vegetables. -  he is advised to stick to a routine mealtimes to eat 3 meals a day and avoid unnecessary snacks (to snack only to correct hypoglycemia).   - he acknowledges that there is a room for improvement in  his food and drink choices. - Suggestion is made for him to avoid simple carbohydrates from his diet including Cakes, Sweet Desserts, Ice Cream, Soda (diet and regular), Sweet Tea, Candies, Chips, Cookies, Store Bought Juices, Alcohol in Excess of 1-2 drinks a day, Artificial Sweeteners, Coffee Creamer, and "Sugar-free" Products. This will help patient to have more stable blood glucose profile and potentially avoid unintended weight gain.  - I have approached him with  the following individualized plan to manage his diabetes and patient agrees:   -he is encouraged to start monitoring glucose 4 times daily, before meals and before bed, to log their readings on the clinic sheets provided, and bring them to review at follow up appointment in 2 weeks.  - Adjustment parameters are given to him for hypo and hyperglycemia in writing. - he is encouraged to call clinic for blood glucose levels less than 70 or above 300 mg /dl. - he is advised to continue Metformin 500 mg ER once daily with breakfast (changed to ER form to avoid GI side effects), therapeutically suitable for patient .  - he will be considered for incretin therapy as appropriate next visit.  - Specific targets for  A1c; LDL, HDL, and Triglycerides were discussed with the patient.  2) Blood Pressure /Hypertension:  his blood pressure is controlled to target.   he is advised to continue his current medications including Norvasc 2.5 mg p.o. daily with breakfast, HCTZ 25 mg po daily, and Losartan 50 mg po daily.  3) Lipids/Hyperlipidemia:    Review of his recent lipid panel from 06/21/20 showed uncontrolled LDL at 122.  he is advised to continue Lipitor 20 mg daily at bedtime.  Side effects and precautions discussed with him.  4)  Weight/Diet:  his Body mass index is 34.99 kg/m.  -  clearly complicating his diabetes care.   he is a candidate for weight loss. I discussed with him the fact that loss of 5 - 10% of his  current body weight will  have the most impact on his diabetes management.  Exercise, and detailed carbohydrates information provided  -  detailed on discharge instructions.  5) Chronic Care/Health Maintenance: -he is on ACEI/ARB and Statin medications and is encouraged to initiate and continue to follow up with Ophthalmology, Dentist, Podiatrist at least yearly or according to recommendations, and advised to QUIT SMOKING. I have recommended yearly flu vaccine and pneumonia vaccine at least every 5 years; moderate intensity exercise for up to 150 minutes weekly; and sleep for at least 7 hours a day.  - he is advised to maintain close follow up with Smith RobertKikel, Stephen, MD for primary care needs, as well as his other providers for optimal and coordinated care.  I advised him to reach back out to PCP regarding his thrush who I believe has also referred him to GI specialist for further evaluation.   - Time spent in this patient care: 60 min, of which > 50% was spent in counseling him about his diabetes and the rest reviewing his blood glucose logs, discussing his hypoglycemia and hyperglycemia episodes, reviewing his current and previous labs/studies (including abstraction from other facilities) and medications doses and developing a long term treatment plan based on the latest standards of care/guidelines; and documenting his care.    Please refer to Patient Instructions for Blood Glucose Monitoring and Insulin/Medications Dosing Guide" in media tab for additional information. Please also refer to "Patient Self Inventory" in the Media tab for reviewed elements of pertinent patient history.  Alejandro Horton participated in the discussions, expressed understanding, and voiced agreement with the above plans.  All questions were answered to his satisfaction. he is encouraged to contact clinic should he have any questions or concerns prior to his return visit.     Follow up plan: - Return in about 2 weeks (around 10/13/2020) for Diabetes  F/U, Bring meter and logs, No previsit labs.    Ronny BaconWhitney Corina Stacy, FNP-BC [facility] [address] [place], [place] [address] Phone: [phone] Fax: [phone]  09/29/2020, 3:07 PM

## 2020-10-14 ENCOUNTER — Ambulatory Visit: Payer: Medicare Other | Admitting: Nurse Practitioner

## 2021-08-08 ENCOUNTER — Other Ambulatory Visit: Payer: Self-pay | Admitting: Nurse Practitioner

## 2022-01-12 ENCOUNTER — Ambulatory Visit: Payer: 59 | Admitting: Urology

## 2022-01-12 ENCOUNTER — Encounter: Payer: Self-pay | Admitting: Urology
